# Patient Record
Sex: Female | Born: 1955 | Race: White | Hispanic: No | Marital: Married
Health system: Southern US, Community
[De-identification: ages and names within clinical notes are randomized; demographics above are authoritative.]

---

## 2021-05-07 ENCOUNTER — Other Ambulatory Visit: Payer: Self-pay

## 2021-05-07 ENCOUNTER — Ambulatory Visit (INDEPENDENT_AMBULATORY_CARE_PROVIDER_SITE_OTHER): Payer: Medicare Other | Admitting: Sports Medicine

## 2021-05-07 VITALS — Ht 66.0 in | Wt 121.0 lb

## 2021-05-07 DIAGNOSIS — G8929 Other chronic pain: Secondary | ICD-10-CM

## 2021-05-07 DIAGNOSIS — M25561 Pain in right knee: Secondary | ICD-10-CM

## 2021-05-07 NOTE — Assessment & Plan Note (Signed)
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  She also has weak hip abduction.  Her limited bedside ultrasound was very reassuring and her exam does not suggest severe osteoarthritis.  She was given hip abduction series and advised to do short arc quad strengthening with a ball between her legs and quad strengthening with her leg ruled out to isolate the VMO.  She also do slow step downs bilaterally.  She can follow-up in 6 weeks if she is not having improvement.

## 2021-05-07 NOTE — Progress Notes (Signed)
Vicki Woodard is a 65 y.o. female who presents to [facility] today for the following:  Right Knee pain Has been occurring for the last year off-and-on Reports that she feels mostly lateral pinching when she occasionally moves to the side Also feels that her leg is hyperextended when it is not She states that she has pain with deep flexion of her knee, mostly notices this when she is doing Pilates Denies any swelling, giving out, locking States that stairs and going downhill worsen her pain She denies theater sign She did report that last week she was having superior pain while she was walking in [place] neighborhood [place], states that it was so severe her husband had to come pick her up in a car She states this has not happened since and never happened before She tries to be active, but 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 Charlann Boxer which is limited her activity She denies any radiation of pain, numbness or tingling   PMH reviewed.  ROS as above. Medications reviewed.  Exam:  Ht 5\' 6"  (1.676 m)   Wt 121 lb (54.9 kg)   BMI 19.53 kg/m  Gen: Well NAD MSK:  Right Knee:  - Inspection: no gross deformity b/l.  She does appear to have a lateral riding right patella.  No swelling/effusion, erythema or bruising b/l. Skin intact - Palpation: no TTP b/l - ROM: full active ROM with flexion and extension in knee and hip on right, she has full range of motion of her knee on left.  Did not fully test her left hip range of motion given her recent surgery. - Strength: 4/5 strength hip abduction, hip flexion on right.  5/5 strength in other fields on right.  4/5 strength in hip flexion on left, otherwise 5/5 strength.  Decreased VMO firing with resisted knee extension on right - Neuro/vasc: NV intact distally b/l - Special Tests: - LIGAMENTS: negative anterior and posterior drawer, negative Lachman's, no MCL or LCL laxity  -- MENISCUS: Positive McMurray's on right, negative Thessaly  --  PF JOINT: nml patellar mobility bilaterally.  Positive patellar grind on right, negative patellar apprehension  Hips: normal ROM, negative FABER and FADIR on right  Limited ultrasound of knee MSK ultrasound knee: Images were obtained both in the transverse and longitudinal plane. Patellar and quadriceps tendons were well visualized with no abnormalities. No effusion. Trochlear view without significant osteoarthritis. Medial and lateral menisci were well visualized with no abnormalities. No obvious Baker's cyst   Impression: no significant OA by criteria  Ultrasound and interpretation by Dr. [place] and Obie Dredge. Fields, MD   No results found.   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  She also has weak hip abduction.  Her limited bedside ultrasound was very reassuring and her exam does not suggest severe osteoarthritis.  She was given hip abduction series and advised to do short arc quad strengthening with a ball between her legs and quad strengthening with her leg ruled out to isolate the VMO.  She also do slow step downs bilaterally.  She can follow-up in 6 weeks if she is not having improvement.   Sibyl Parr, D.O.  PGY-4 [facility]  05/07/2021 5:11 PM  I observed and examined the patient with the [facility] resident and agree with assessment and plan.  Note reviewed and modified by me. KB  Darrick Penna, MD

## 2021-05-07 NOTE — Patient Instructions (Signed)
Thank you for coming to see me today. It was a pleasure. Today we talked about:   Your knee cap (patella) is tracking to the outside of your knee, not in the groove like it is supposed to, which is causing your pain.  The strengthening exercises that we went over with you will help.    Your ultrasound did not show bad arthritic changes.  Please follow-up with [place] in 6 weeks if you are not improved.  If you have any questions or concerns, please do not hesitate to call the office at [phone].  Best,   Luis Abed, DO [facility]

## 2021-08-11 ENCOUNTER — Ambulatory Visit (INDEPENDENT_AMBULATORY_CARE_PROVIDER_SITE_OTHER): Payer: Medicare Other | Admitting: Sports Medicine

## 2021-08-11 VITALS — BP 128/82 | Ht 66.0 in | Wt 122.0 lb

## 2021-08-11 DIAGNOSIS — M25561 Pain in right knee: Secondary | ICD-10-CM

## 2021-08-11 DIAGNOSIS — G8929 Other chronic pain: Secondary | ICD-10-CM | POA: Diagnosis not present

## 2021-08-11 NOTE — Patient Instructions (Addendum)
Call [facility] to schedule your MRI. Wear the compression sleeve throughout the day to help with swelling, don't sleep in it. Continue to exercise/walk as tolerated. Consider resting during days your knee is swollen (like today). Do the exercises you were give today (leg raise, isometric quad, side leg raises for hip adduction and hip abduction) You can ice for 20 minutes 3-4 times a day. Call with any questions.

## 2021-08-11 NOTE — Assessment & Plan Note (Signed)
True cause unclear on exam and [place]  Plan to get MRI/ XR at ortho did not show significant OA  S/P left hip replacement by Dr Charlann Boxer and has done well/ he did knee XR as well

## 2021-08-11 NOTE — Progress Notes (Signed)
Chief complaint: Right knee pain follow-up  HPI: She presents for progressive pain and swelling in her right knee.  Last office visit was in July at which time we thought her pain was most likely stemming from patellofemoral pain syndrome secondary to her lateral riding patella. Since her office visit in July, she notes that her and her husband took a trip to [place] and went to go see the capital.  We will use the elevator for the way up however on the way down, they had to walk up the stairs and she notes that she experienced significant swelling apparently after that.  Since then, the swelling has not resolved.  Her pain is diffuse and is worse with going downstairs.  She has not been taking anything for pain relief. She denies fever, chills, traumatic injury the knee.  Denies prior surgical procedures on the knee.  Exam Blood pressure 128/82, height 5\' 6"  (1.676 m), weight 122 lb (55.3 kg).  General: Well-appearing female no distress Vascular: Pedal pulses intact bilaterally MSK: Large right knee effusion.  No significant tenderness on palpation.  No posterior popliteal pain.  No calf pain, negative Homans' sign.  No swelling of the lower extremity.  No erythema but the right knee is a little warmer than the left.  Right knee flexion is limited to about 90 degrees due to pain.  Extension intact.  Negative McMurray's.  Point-of-care ultrasound of the right knee: Large effusion.  No significant arthritic changes appreciable.  No significant meniscus abnormalities.PF joint looks to have good cartilage.  No significant spurring. Impression: large effusion of Right knee  Ultrasound and interpretation by Dr. and Ephriam Knuckles. Fields, MD   Assessment/plan Right knee pain with effusion Based on the degree of effusion that she has on exam today, I suspect that there is something more going on more than the patellofemoral syndrome although this may have been the initial issue.  Although this could  cause a certain degree of effusion, it should not be to this extent.  She could have arthritis that is simply not being able to be seen on ultrasound.  Her recent XR showed only mild OA changes.  I believe she needs an MRI to assess for OCD lesion of IA cartilage loss.  Offered injection and drainage but declined unless becomes more painful.  Ice and compression/ Nsaids/ relative rest Call back post MRI.  I observed and examined the patient with the resident and agree with assessment and plan.  Note reviewed and modified by me. Sibyl Parr, MD

## 2021-08-21 ENCOUNTER — Ambulatory Visit
Admission: RE | Admit: 2021-08-21 | Discharge: 2021-08-21 | Disposition: A | Discharge status: Home / Self Care | Payer: Medicare Other | Source: Ambulatory Visit | Attending: Sports Medicine | Admitting: Sports Medicine

## 2021-08-21 DIAGNOSIS — M25561 Pain in right knee: Secondary | ICD-10-CM

## 2021-08-21 DIAGNOSIS — G8929 Other chronic pain: Secondary | ICD-10-CM

## 2021-09-02 ENCOUNTER — Ambulatory Visit (INDEPENDENT_AMBULATORY_CARE_PROVIDER_SITE_OTHER): Payer: Medicare Other | Admitting: Sports Medicine

## 2021-09-02 DIAGNOSIS — M25561 Pain in right knee: Secondary | ICD-10-CM | POA: Diagnosis present

## 2021-09-02 DIAGNOSIS — M25461 Effusion, right knee: Secondary | ICD-10-CM

## 2021-09-02 DIAGNOSIS — G8929 Other chronic pain: Secondary | ICD-10-CM

## 2021-09-02 MED ORDER — METHYLPREDNISOLONE ACETATE 40 MG/ML IJ SUSP
40.0000 mg | Freq: Once | INTRAMUSCULAR | Status: AC
  Administered 2021-09-02: 40 mg via INTRA_ARTICULAR

## 2021-09-02 NOTE — Progress Notes (Signed)
PCP: Vicki Manges, DO  Subjective:   HPI: Patient is a 65 y.o. female here for follow-up of right knee pain and swelling.  She had previously been following up with Dr. Darrick Woodard over the last few months for ongoing right knee pain.  The knee in general has been bothering her for about the last year, although worse over the last few months after taking a trip and walking much more frequently.  She denies any trauma or known injury to the knee.  Knee pain is worse on the medial joint line compared to the lateral.  She recently had an MRI of the right knee, see radiology read below, although in general had a large joint effusion, and tricompartmental cartilage abnormalities without meniscus or ligamental injury.  She will occasionally take over-the-counter anti-inflammatories, but does not take anything on a consistent basis.  Her pain does persist without much improvement.  BP 114/60   Ht 5\' 6"  (1.676 m)   Wt 124 lb (56.2 kg)   BMI 20.01 kg/m   [facility] 08/11/2021  Frequency of aerobic exercise (# of days/week) 0  Average time in minutes 0  Frequency of strengthening activities (# of days/week) 0    No flowsheet data found. MR Knee Right Wo Contrast CLINICAL DATA:  Patient complains of right knee pain and swelling x 1 year; no known injury. Patient denies history of surgery or cancer.  EXAM: MRI OF THE RIGHT KNEE WITHOUT CONTRAST  TECHNIQUE: Multiplanar, multisequence MR imaging of the knee was performed. No intravenous contrast was administered.  COMPARISON:  None.  FINDINGS: MENISCI  Medial: Intact.  Lateral: Intact.  LIGAMENTS  Cruciates: ACL and PCL are intact.  Collaterals: Medial collateral ligament is intact. Lateral collateral ligament complex is intact.  CARTILAGE  Patellofemoral: High-grade partial-thickness cartilage loss with areas of full-thickness cartilage loss of the patellofemoral compartment.  Medial:  Partial-thickness cartilage loss of the medial femorotibial compartment with subchondral reactive marrow edema in the medial femoral condyle.  Lateral: Partial-thickness cartilage loss of the lateral femorotibial compartment.  JOINT: Large joint effusion. Normal Hoffa's fat-pad. No plical thickening.  POPLITEAL FOSSA: Popliteus tendon is intact. No Baker's cyst.  EXTENSOR MECHANISM: Intact quadriceps tendon. Intact patellar tendon. Intact lateral patellar retinaculum. Intact medial patellar retinaculum. Intact MPFL.  BONES: No aggressive osseous lesion. No fracture or dislocation. Subcortical reactive marrow changes at the root of the posterior horn of the medial meniscus.  Other: No fluid collection or hematoma. Muscles are normal.  IMPRESSION: 1. No meniscal or ligamentous injury of the right knee. 2.  Tricompartmental cartilage abnormalities as described above. 3. Large joint effusion.  Electronically Signed   By: 08/13/2021 M.D.   On: 08/22/2021 09:42       Objective:  Physical Exam:  Gen: Well-appearing, in no acute distress; non-toxic CV: Regular Rate. Well-perfused. Warm.  Resp: Breathing unlabored on room air; no wheezing. Psych: Fluid speech in conversation; appropriate affect; normal thought process Neuro: Sensation intact throughout. No gross coordination deficits.  MSK:  - Right knee: Inspection yields a considerable joint effusion, very slightly warm to touch although no redness/erythema, or ecchymosis.  There is a mild degree of medial joint line tenderness, no lateral joint line tenderness.  Patella is somewhat ballotable, although no pain with patellar deviation or compression.  Range of motion is full in flexion 235 degrees and extension to 0 degrees.  Strength 5/5 throughout.  She is neurovascular intact distally.  There is no instability varus  or valgus stress at 0 and 30 degrees, negative anterior/posterior drawer, negative Lachman's.     Assessment  & Plan:  1. Right knee pain, chronic 2. Right knee effusion - bloody synovial fluid  Patient has had ongoing right knee pain for few months to year that she denies any trauma or known injury.  Given her recurrent significant effusion, an MRI was obtained which showed high-grade partial-thickness cartilage loss of the patellofemoral compartment and partial-thickness cartilage loss of both the medial and lateral femorotibial compartments.  There was no evidence of meniscal or ligamental injury.  I was slightly surprised that her aspiration was a consistent mixture of both blood and synovial fluid, however given her recent MRI there is no contraindication to subsequently injecting cortisone. Patient tolerated procedure well.    Procedure: After discussion on R/B/I, informed written consent timeout was performed, patient was lying supine on exam table.  The right knee was prepped with alcohol swab.  Utilizing superolateral approach, 7 mL of lidocaine was used for local anesthesia. Then using an 18g needle on 60cc syringe, approximately 39 mL of a dark-red combination of bloody and synovial fluid was aspirated from the right knee. There were a few scattered cartilage particles within the fluid as well.  Knee was then injected with 3:1 lidocaine:depomedrol. Patient tolerated procedure well without immediate complications.  Plan: -Discussed with the patient her evidence of bloody synovial fluid aspiration.  Usually this is a sign of trauma or underlying knee pathology, although her MRI was relatively benign except for some OA and cartilage loss.  I am curious how she will respond to the steroid injection and if she has a recurrent knee effusion. The only better evaluation would be knee arthroscopy, and I feel this would be unnecessary at this point unless she does not receive benefit from this aspiration and injection or her knee pain becomes debilitating.  -We will follow-up in about 3 weeks to reevaluate -She  may use ice and over-the-counter anti-inflammatories for any postinjection pain  Vicki Brunner, DO PGY-4, Sports Medicine Fellow [facility]   Addendum:  I was the preceptor for this visit and available for immediate consultation.  Norton Blizzard MD Vicki Woodard

## 2021-09-23 ENCOUNTER — Ambulatory Visit (INDEPENDENT_AMBULATORY_CARE_PROVIDER_SITE_OTHER): Payer: Medicare Other | Admitting: Sports Medicine

## 2021-09-23 VITALS — Ht 66.0 in | Wt 122.0 lb

## 2021-09-23 DIAGNOSIS — M25561 Pain in right knee: Secondary | ICD-10-CM

## 2021-09-23 DIAGNOSIS — G8929 Other chronic pain: Secondary | ICD-10-CM | POA: Diagnosis not present

## 2021-09-23 NOTE — Progress Notes (Signed)
PCP: Vicki Manges, DO  Subjective:   HPI: Patient is a 65 y.o. female here for follow-up of right knee pain.  Vicki Woodard is a very pleasant female with follow-up for right knee pain.  I last saw her on 09/02/2021 where she had a knee effusion and had about 40 cc of fluid aspirated off of the knee, and subsequently injected cortisone into the knee.  She states since this time her knee has drastically improved.  She feels she is about 98-100% better.  She does note that very occasionally when she is very active the right knee will have a small degree of swelling, but this improves with ice and/or occasional ibuprofen.  She has been very active recently with Christmas decorations at home and in the church, but she is able to do this without pain.  When she does get pain she will get pain over the lateral aspect of the patella, sometimes some crepitus although no giving out or locking of the knee.  No new injuries.  She is traveling to [place] and [place] this upcoming May 2023.  Will be walking a lot during this trip.  No Known Allergies  Ht 5\' 6"  (1.676 m)   Wt 122 lb (55.3 kg)   BMI 19.69 kg/m   [facility] 08/11/2021 09/23/2021  Frequency of aerobic exercise (# of days/week) 0 0  Average time in minutes 0 0  Frequency of strengthening activities (# of days/week) 0 0    No flowsheet data found.      Objective:  Physical Exam:  Gen: Well-appearing, in no acute distress; non-toxic CV: Regular Rate. Well-perfused. Warm.  Resp: Breathing unlabored on room air; no wheezing. Psych: Fluid speech in conversation; appropriate affect; normal thought process Neuro: Sensation intact throughout. No gross coordination deficits.  MSK:  - Right knee: Trace effusion of the right knee; inspection yields no erythema, ecchymosis or warmth.  Mild TTP over the lateral aspect of the superior patella, no medial joint line tenderness.  Range of motion 0-115 degrees of right knee, left  knee 0-135 degrees.  There is no varus or valgus instability, negative anterior/posterior drawer, Lachman's.  Mild pain with manipulation of the patella with Clark's grind test.  Negative McMurray's testing.  Strength 5/5.  Neurovascular intact distally.   Assessment & Plan:  1. Right knee pain - improved. MRI 11/22 with high-grade partial-thickness cartilage loss of patellofemoral, medial and lateral compartments of the knee. S/p aspiration and CS injection on 08/23/21 with drastic improvement.  -Discussed in detail her degree of arthritis and cartilage loss throughout the knee, there may be times where her knee is slightly "aggravated" -Continue to wear neoprene compression sleeve with activity -She may ice and/or take over-the-counter ibuprofen only as needed for any mild flareup -She may follow-up as needed  13/6/22, DO PGY-4, Sports Medicine Fellow [facility]  Addendum:  I was the preceptor for this visit and available for immediate consultation.  [facility] MD Norton Blizzard

## 2022-09-14 ENCOUNTER — Telehealth: Payer: Self-pay | Admitting: Sports Medicine

## 2022-09-14 NOTE — Telephone Encounter (Signed)
Pt returned call to Dr Shon Baton. Pt states the dr left a voicemail and she need him to return her call. Please call pt at [phone].

## 2022-10-13 IMAGING — MR MR KNEE*R* W/O CM
7 series · 40 of 40 positions shown · non-contrast
Comparison: None.

CLINICAL DATA: Patient complains of right knee pain and swelling x
1 year; no known injury. Patient denies history of surgery or
cancer.

EXAM:
MRI OF THE RIGHT KNEE WITHOUT CONTRAST
TECHNIQUE: Multiplanar, multisequence MR imaging of the knee was performed. No
intravenous contrast was administered.

[Series 6: T2 fat-sat · axial · right · 4.0mm · 0.50mm/px · z∈[-42,+110]mm · 9 of 36 slices shown (1 of 3)]
[im 1/36]
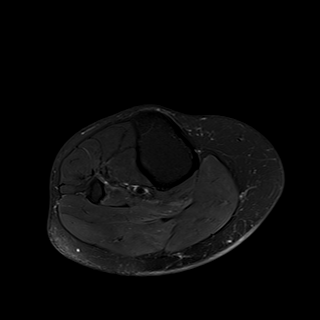
[im 5/36]
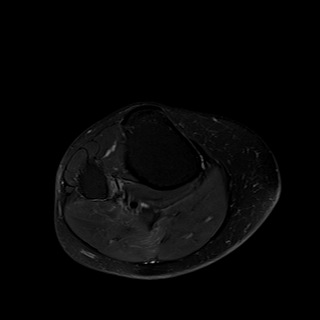
[im 9/36]
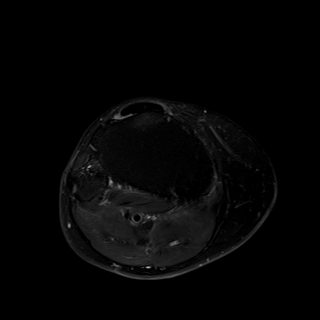
[im 14/36]
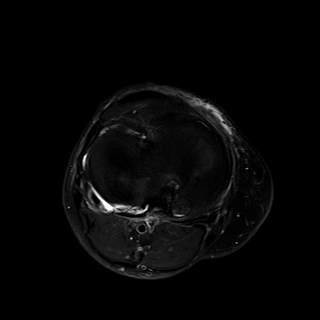
[im 18/36]
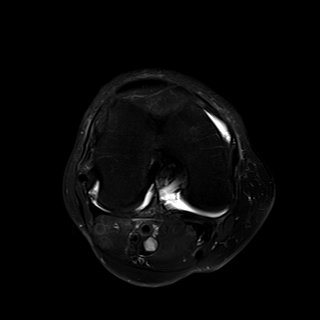
[im 22/36]
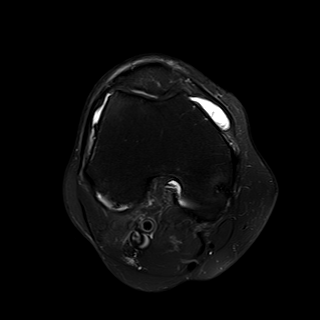
[im 27/36]
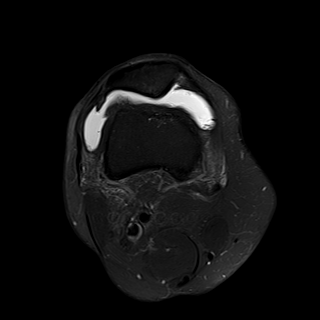
[im 31/36]
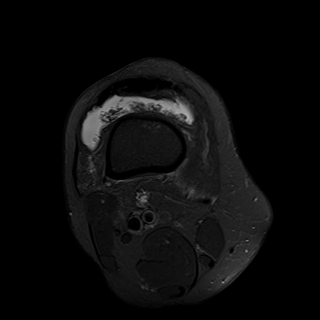
[im 36/36]
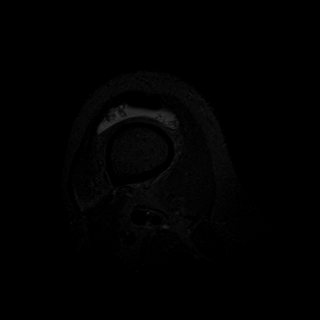

[Series 7: T2 fat-sat · coronal · right · 4.0mm · 0.47mm/px · 5 of 25 slices shown (2 of 3)]
[im 1/25]
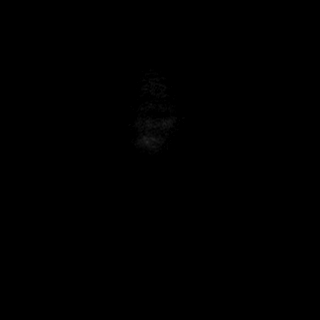
[im 7/25]
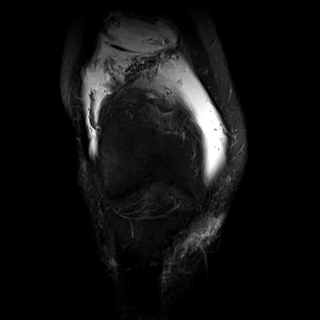
[im 13/25]
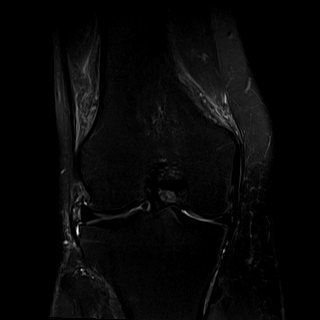
[im 19/25]
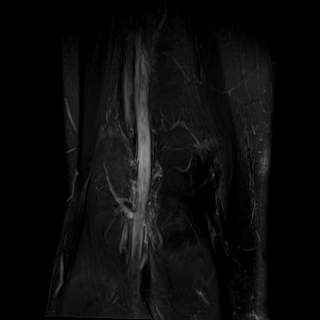
[im 25/25]
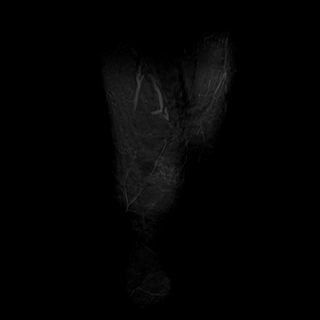

[Series 8: T2 fat-sat · sagittal · right · 3.0mm · 0.39mm/px · 5 of 25 slices shown (3 of 3)]
[im 1/25]
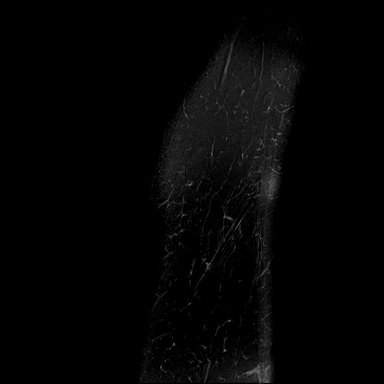
[im 7/25]
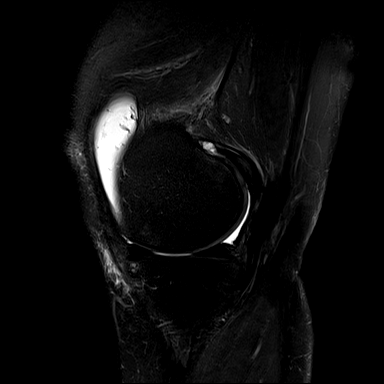
[im 13/25]
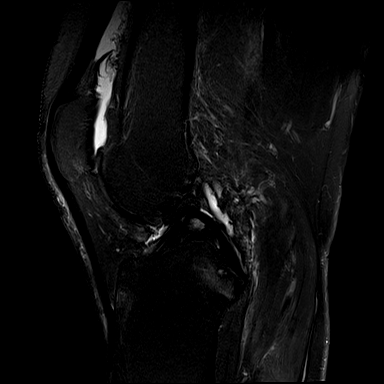
[im 19/25]
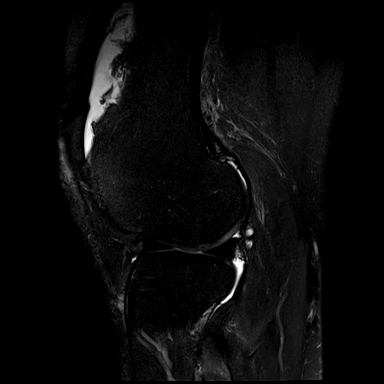
[im 25/25]
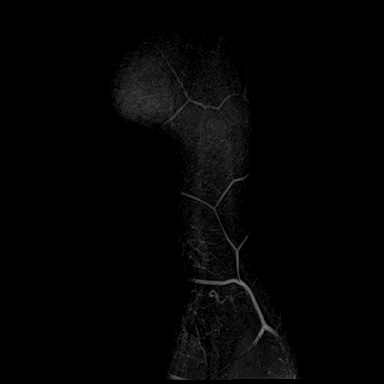

[Series 9: T1 · coronal · right · 4.0mm · 0.47mm/px · 5 of 25 slices shown]
[im 1/25]
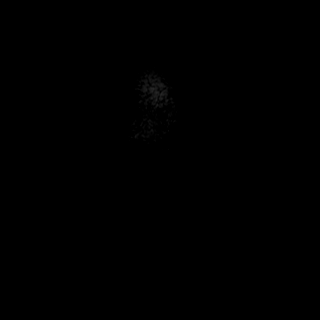
[im 7/25]
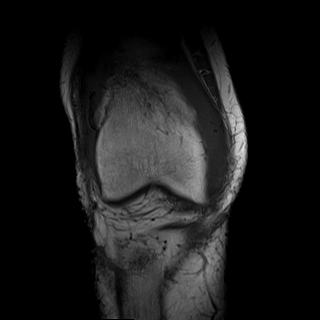
[im 13/25]
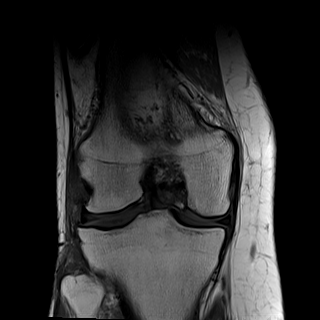
[im 19/25]
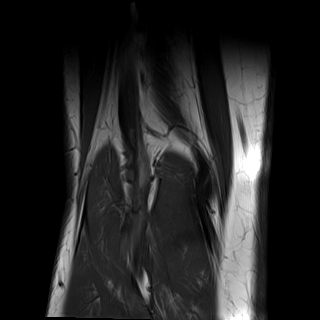
[im 25/25]
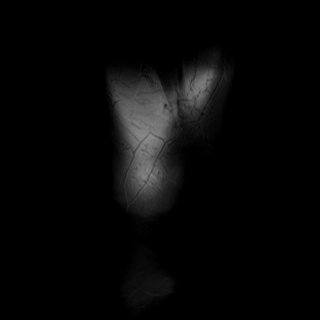

[Series 10: PD fat-sat · coronal · right · 3.0mm · 0.47mm/px · 6 of 28 slices shown (1 of 2)]
[im 1/28]
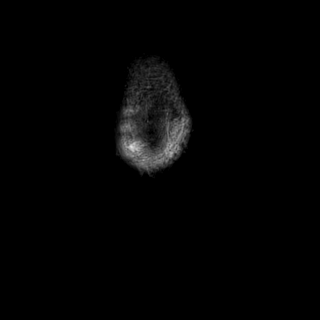
[im 6/28]
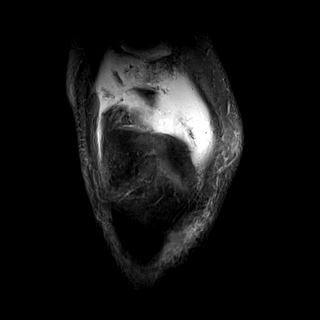
[im 11/28]
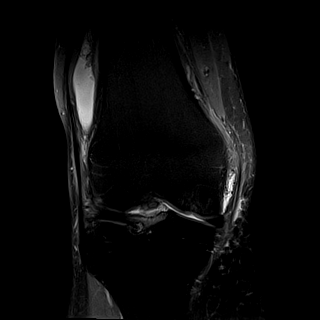
[im 17/28]
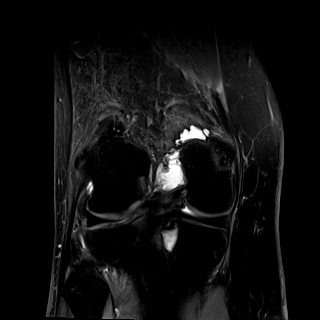
[im 22/28]
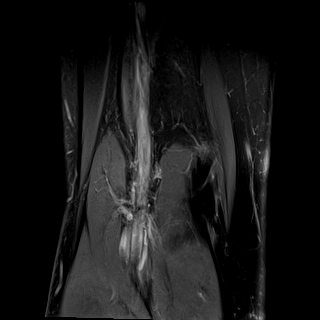
[im 28/28]
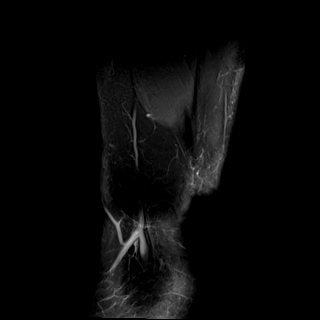

[Series 11: PD fat-sat · sagittal · right · 3.0mm · 0.39mm/px · 5 of 25 slices shown (2 of 2)]
[im 1/25]
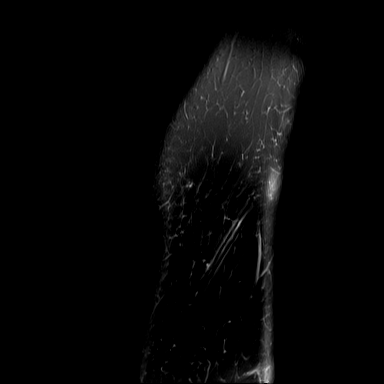
[im 7/25]
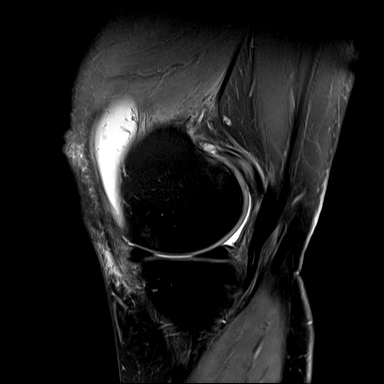
[im 13/25]
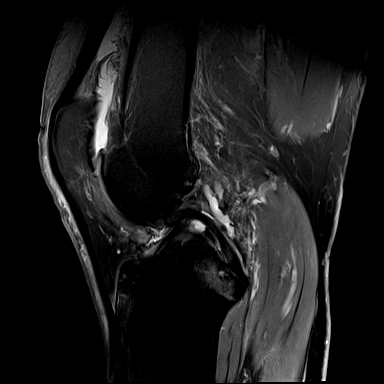
[im 19/25]
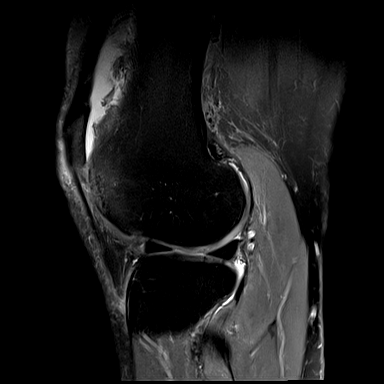
[im 25/25]
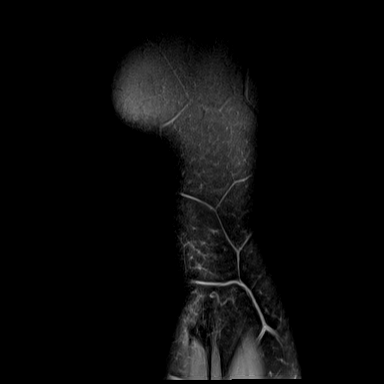

[Series 12: PD · coronal · right · 1.5mm · 0.44mm/px · 5 of 21 slices shown]
[im 1/21]
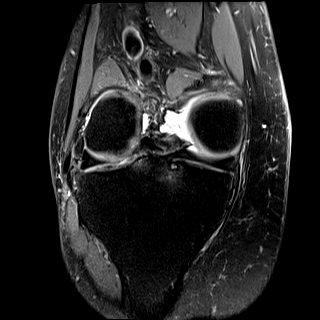
[im 6/21]
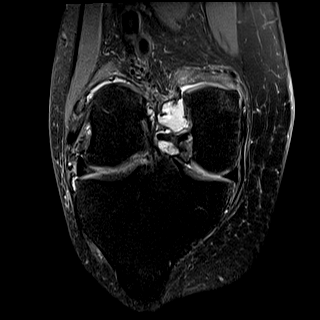
[im 11/21]
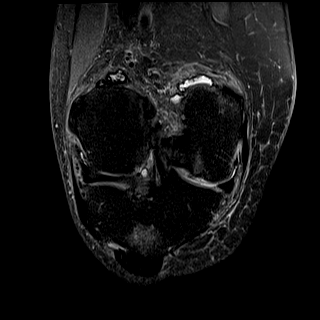
[im 16/21]
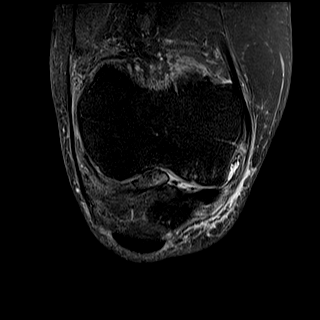
[im 21/21]
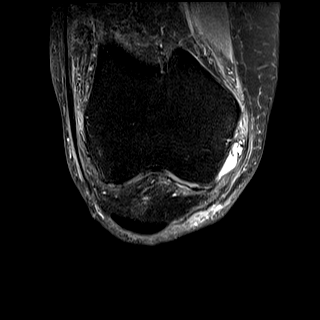

[40 of 40 positions shown; findings below may reference images not displayed]

FINDINGS: MENISCI

Medial: Intact.

Lateral: Intact.

LIGAMENTS

Cruciates: ACL and PCL are intact.

Collaterals: Medial collateral ligament is intact. Lateral
collateral ligament complex is intact.

CARTILAGE

Patellofemoral: High-grade partial-thickness cartilage loss with
areas of full-thickness cartilage loss of the patellofemoral
compartment.

Medial: Partial-thickness cartilage loss of the medial femorotibial
compartment with subchondral reactive marrow edema in the medial
femoral condyle.

Lateral: Partial-thickness cartilage loss of the lateral
femorotibial compartment.

JOINT: Large joint effusion. Normal Malermeister Stian. No plical
thickening.

POPLITEAL FOSSA: Popliteus tendon is intact. No Baker's cyst.

EXTENSOR MECHANISM: Intact quadriceps tendon. Intact patellar
tendon. Intact lateral patellar retinaculum. Intact medial patellar
retinaculum. Intact MPFL.

BONES: No aggressive osseous lesion. No fracture or dislocation.
Subcortical reactive marrow changes at the root of the posterior
horn of the medial meniscus.

Other: No fluid collection or hematoma. Muscles are normal.
IMPRESSION: 1. No meniscal or ligamentous injury of the right knee.
2.  Tricompartmental cartilage abnormalities as described above.
3. Large joint effusion.
